# Patient Record
Sex: Male | Born: 1985 | Race: Black or African American | Hispanic: No | Marital: Single | Smoking: Current every day smoker
Health system: Southern US, Community
[De-identification: ages and names within clinical notes are randomized; demographics above are authoritative.]

## PROBLEM LIST (undated history)

## (undated) ENCOUNTER — Ambulatory Visit (HOSPITAL_COMMUNITY): Admission: EM | Payer: Self-pay | Source: Home / Self Care

## (undated) DIAGNOSIS — F4310 Post-traumatic stress disorder, unspecified: Secondary | ICD-10-CM

## (undated) DIAGNOSIS — I10 Essential (primary) hypertension: Secondary | ICD-10-CM

## (undated) DIAGNOSIS — M1990 Unspecified osteoarthritis, unspecified site: Secondary | ICD-10-CM

## (undated) HISTORY — PX: ABDOMINAL SURGERY: SHX537

---
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
## 2013-12-22 NOTE — ED Notes (Signed)
I have reviewed discharge instructions with the patient.  The patient verbalized understanding.      Pt given RX for following medications at discharge @MEDDISCHARGE@.  Pt discharged ambulatory from emergency department in no acute distress.
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
## 2014-03-31 NOTE — ED Notes (Signed)
Patient at this time stands up and asks how long it will be until he is seen by physician, when I advised that I was not sure and that there are several patient in ER today patient advised that he cant waste time not being seen in the hospital. Lacie DraftJ. Melton [facility] is advised that she will see patient, all visualized by patient who then asks again how long it will be. I advised patient that She will come to see him in a few minutes and patient states "I cant wait that long" and starts to walk from facility. Patient was advised to stop so I could remove IV, Patient waited for IV to be removed however would not stay to sign AMA paperwork.

## 2014-05-13 LAB — CBC WITH AUTOMATED DIFF
ABS. BASOPHILS: 0 10*3/uL (ref 0.0–0.2)
ABS. EOSINOPHILS: 0.2 10*3/uL (ref 0.0–0.8)
ABS. IMM. GRANS.: 0 10*3/uL (ref 0.0–0.5)
ABS. LYMPHOCYTES: 3.3 10*3/uL (ref 0.5–4.6)
ABS. MONOCYTES: 0.4 10*3/uL (ref 0.1–1.3)
ABS. NEUTROPHILS: 1.8 10*3/uL (ref 1.7–8.2)
BASOPHILS: 0 % (ref 0.0–2.0)
EOSINOPHILS: 3 % (ref 0.5–7.8)
HCT: 47.4 % (ref 41.1–50.3)
HGB: 16.5 g/dL (ref 13.6–17.2)
IMMATURE GRANULOCYTES: 0 % (ref 0.0–5.0)
LYMPHOCYTES: 58 % — ABNORMAL HIGH (ref 13–44)
MCH: 32.2 PG (ref 26.1–32.9)
MCHC: 34.8 g/dL (ref 31.4–35.0)
MCV: 92.6 FL (ref 79.6–97.8)
MONOCYTES: 7 % (ref 4.0–12.0)
MPV: 9.3 FL — ABNORMAL LOW (ref 10.8–14.1)
NEUTROPHILS: 32 % — ABNORMAL LOW (ref 43–78)
PLATELET COMMENTS: ADEQUATE
PLATELET: 337 10*3/uL (ref 150–450)
RBC: 5.12 M/uL (ref 4.23–5.67)
RDW: 13.9 % (ref 11.9–14.6)
WBC: 5.7 10*3/uL (ref 4.3–11.1)

## 2014-05-13 LAB — METABOLIC PANEL, COMPREHENSIVE
A-G Ratio: 0.9 — ABNORMAL LOW (ref 1.2–3.5)
ALT (SGPT): 40 U/L (ref 12–65)
AST (SGOT): 42 U/L — ABNORMAL HIGH (ref 15–37)
Albumin: 3.9 g/dL (ref 3.5–5.0)
Alk. phosphatase: 89 U/L (ref 50–136)
Anion gap: 9 mmol/L (ref 7–16)
BUN: 6 MG/DL (ref 6–23)
Bilirubin, total: 0.2 MG/DL (ref 0.2–1.1)
CO2: 24 mmol/L (ref 21–32)
Calcium: 8.4 MG/DL (ref 8.3–10.4)
Chloride: 111 mmol/L — ABNORMAL HIGH (ref 98–107)
Creatinine: 1.17 MG/DL (ref 0.8–1.5)
GFR est AA: >60 mL/min/{1.73_m2} (ref >60)
GFR est non-AA: >60 mL/min/{1.73_m2} (ref >60)
Globulin: 4.4 g/dL — ABNORMAL HIGH (ref 2.3–3.5)
Glucose: 98 mg/dL (ref 65–100)
Potassium: 3.8 mmol/L (ref 3.5–5.1)
Protein, total: 8.3 g/dL — ABNORMAL HIGH (ref 6.3–8.2)
Sodium: 144 mmol/L (ref 136–145)

## 2014-05-13 LAB — ETHYL ALCOHOL: ALCOHOL(ETHYL),SERUM: 304 MG/DL

## 2014-05-13 LAB — PHENYTOIN: Phenytoin: 0.9 ug/mL — ABNORMAL LOW (ref 10–20)

## 2014-05-13 MED ORDER — LORAZEPAM 2 MG/ML IJ SOLN
2 mg/mL | INTRAMUSCULAR | Status: AC
Start: 2014-05-13 — End: 2014-05-13
  Administered 2014-05-13: 18:00:00 via INTRAMUSCULAR

## 2014-05-13 MED ORDER — PHENYTOIN SODIUM EXTENDED 100 MG CAP
100 mg | ORAL_CAPSULE | Freq: Three times a day (TID) | ORAL | Status: AC
Start: 2014-05-13 — End: 2014-06-12

## 2014-05-13 MED ORDER — LORAZEPAM 2 MG/ML IJ SOLN
2 mg/mL | INTRAMUSCULAR | Status: AC
Start: 2014-05-13 — End: 2014-05-14

## 2014-05-13 MED FILL — LORAZEPAM 2 MG/ML IJ SOLN: 2 mg/mL | INTRAMUSCULAR | Qty: 1

## 2014-05-13 NOTE — ED Notes (Signed)
Patient sleeping comfortably in bed
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
## 2014-05-13 NOTE — ED Notes (Signed)
Pt here with complaint of seizures, pt states he is prescribed dilantin but does not take it.

## 2014-05-13 NOTE — Other (Signed)
I have reviewed discharge instructions with the patient.  The patient verbalized understanding. Patient given discharge paperwork and instruction on F/u. Patient given prescriptions today with instruction given on proper medciation usage. Patient walked fro dept with friend. Present at bedside.

## 2014-12-10 DIAGNOSIS — R400 Somnolence: Secondary | ICD-10-CM

## 2014-12-10 NOTE — ED Notes (Signed)
[place] city bus[place]  Difficult to wake by EMS.  Pt has history of seizures and does not take any medication.

## 2014-12-10 NOTE — ED Notes (Signed)
I have reviewed discharge instructions with the patient.  The patient verbalized understanding.
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
## 2014-12-11 ENCOUNTER — Inpatient Hospital Stay
Admit: 2014-12-11 | Discharge: 2014-12-11 | Disposition: A | Discharge status: Home / Self Care | Payer: MEDICAID | Attending: Emergency Medicine

## 2014-12-11 LAB — GLUCOSE, POC
Glucose (POC): 113 mg/dL — ABNORMAL HIGH (ref 65–100)
Glucose (POC): 101 mg/dL — ABNORMAL HIGH (ref 65–100)

## 2017-03-06 ENCOUNTER — Emergency Department (HOSPITAL_COMMUNITY)
Admission: EM | Admit: 2017-03-06 | Discharge: 2017-03-06 | Disposition: A | Discharge status: Home / Self Care | Payer: Medicaid Other | Source: Home / Self Care | Attending: Emergency Medicine | Admitting: Emergency Medicine

## 2017-03-06 ENCOUNTER — Emergency Department (HOSPITAL_COMMUNITY)
Admission: EM | Admit: 2017-03-06 | Discharge: 2017-03-06 | Payer: Medicaid Other | Attending: Emergency Medicine | Admitting: Emergency Medicine

## 2017-03-06 ENCOUNTER — Encounter (HOSPITAL_COMMUNITY): Payer: Self-pay | Admitting: Oncology

## 2017-03-06 ENCOUNTER — Emergency Department (HOSPITAL_COMMUNITY): Payer: Medicaid Other

## 2017-03-06 DIAGNOSIS — R45851 Suicidal ideations: Secondary | ICD-10-CM | POA: Diagnosis not present

## 2017-03-06 DIAGNOSIS — Y929 Unspecified place or not applicable: Secondary | ICD-10-CM | POA: Insufficient documentation

## 2017-03-06 DIAGNOSIS — F10920 Alcohol use, unspecified with intoxication, uncomplicated: Secondary | ICD-10-CM | POA: Insufficient documentation

## 2017-03-06 DIAGNOSIS — Y999 Unspecified external cause status: Secondary | ICD-10-CM | POA: Diagnosis not present

## 2017-03-06 DIAGNOSIS — I10 Essential (primary) hypertension: Secondary | ICD-10-CM

## 2017-03-06 DIAGNOSIS — Y939 Activity, unspecified: Secondary | ICD-10-CM | POA: Diagnosis not present

## 2017-03-06 DIAGNOSIS — S0990XA Unspecified injury of head, initial encounter: Secondary | ICD-10-CM | POA: Insufficient documentation

## 2017-03-06 DIAGNOSIS — W2209XA Striking against other stationary object, initial encounter: Secondary | ICD-10-CM | POA: Insufficient documentation

## 2017-03-06 DIAGNOSIS — F17210 Nicotine dependence, cigarettes, uncomplicated: Secondary | ICD-10-CM | POA: Diagnosis not present

## 2017-03-06 HISTORY — DX: Unspecified osteoarthritis, unspecified site: M19.90

## 2017-03-06 HISTORY — DX: Post-traumatic stress disorder, unspecified: F43.10

## 2017-03-06 HISTORY — DX: Essential (primary) hypertension: I10

## 2017-03-06 LAB — COMPREHENSIVE METABOLIC PANEL
ALBUMIN: 3.7 g/dL (ref 3.5–5.0)
ALT: 24 U/L (ref 17–63)
ANION GAP: 9 (ref 5–15)
AST: 36 U/L (ref 15–41)
Alkaline Phosphatase: 77 U/L (ref 38–126)
BUN: 7 mg/dL (ref 6–20)
CHLORIDE: 112 mmol/L — AB (ref 101–111)
CO2: 23 mmol/L (ref 22–32)
Calcium: 8.1 mg/dL — ABNORMAL LOW (ref 8.9–10.3)
Creatinine, Ser: 1.2 mg/dL (ref 0.61–1.24)
GFR calc Af Amer: >60 mL/min (ref >60)
GFR calc non Af Amer: >60 mL/min (ref >60)
GLUCOSE: 84 mg/dL (ref 65–99)
Potassium: 3.7 mmol/L (ref 3.5–5.1)
SODIUM: 144 mmol/L (ref 135–145)
Total Bilirubin: 0.5 mg/dL (ref 0.3–1.2)
Total Protein: 7 g/dL (ref 6.5–8.1)

## 2017-03-06 LAB — CBC WITH DIFFERENTIAL/PLATELET
BASOS PCT: 1 %
Basophils Absolute: 0 10*3/uL (ref 0.0–0.1)
EOS ABS: 0.1 10*3/uL (ref 0.0–0.7)
EOS PCT: 1 %
HCT: 41.6 % (ref 39.0–52.0)
HEMOGLOBIN: 14.8 g/dL (ref 13.0–17.0)
Lymphocytes Relative: 56 %
Lymphs Abs: 3.7 10*3/uL (ref 0.7–4.0)
MCH: 31 pg (ref 26.0–34.0)
MCHC: 35.6 g/dL (ref 30.0–36.0)
MCV: 87 fL (ref 78.0–100.0)
Monocytes Absolute: 0.3 10*3/uL (ref 0.1–1.0)
Monocytes Relative: 4 %
NEUTROS PCT: 38 %
Neutro Abs: 2.5 10*3/uL (ref 1.7–7.7)
PLATELETS: 280 10*3/uL (ref 150–400)
RBC: 4.78 MIL/uL (ref 4.22–5.81)
RDW: 13.4 % (ref 11.5–15.5)
WBC: 6.6 10*3/uL (ref 4.0–10.5)

## 2017-03-06 LAB — ETHANOL: ALCOHOL ETHYL (B): 229 mg/dL — AB (ref <5)

## 2017-03-06 LAB — CBG MONITORING, ED: Glucose-Capillary: 83 mg/dL (ref 65–99)
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
## 2017-03-06 NOTE — ED Notes (Signed)
MD at bedside. 

## 2017-03-06 NOTE — ED Notes (Signed)
Patient alert, sitting up in bed. MD made aware.
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
## 2017-03-06 NOTE — ED Notes (Signed)
ED Provider at bedside. 
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
## 2017-03-06 NOTE — ED Notes (Signed)
Patient transported to CT 
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
## 2018-08-03 IMAGING — CT CT HEAD W/O CM
3 of 4 series · 14 of 47 positions shown, 16 images · non-contrast
Comparison: None.

CLINICAL DATA: Alcohol intoxication.

EXAM:
CT HEAD WITHOUT CONTRAST
TECHNIQUE: Contiguous axial images were obtained from the base of the skull
through the vertex without intravenous contrast.

[Series 2: head w/o · axial · non-contrast · 0.45mm/px · z∈[-62,+64]mm · 8 of 31 slices shown, 10 images]
[im 3/31  brain]
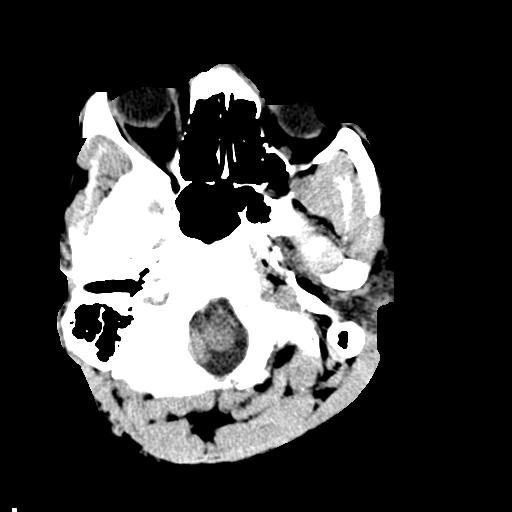
[im 3/31  bone]
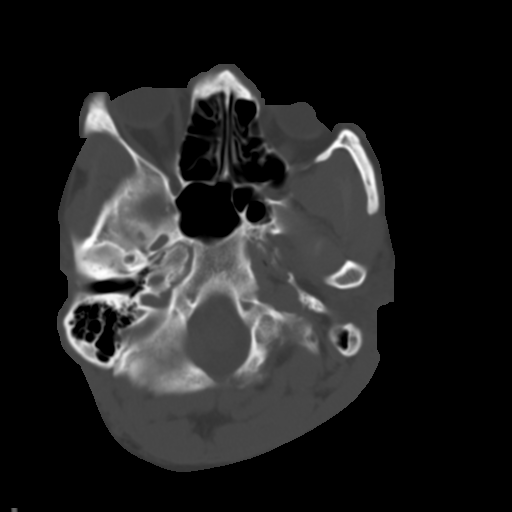
[im 7/31  brain]
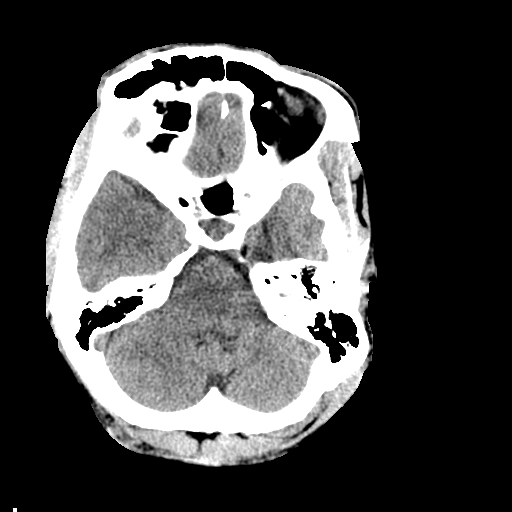
[im 11/31  brain]
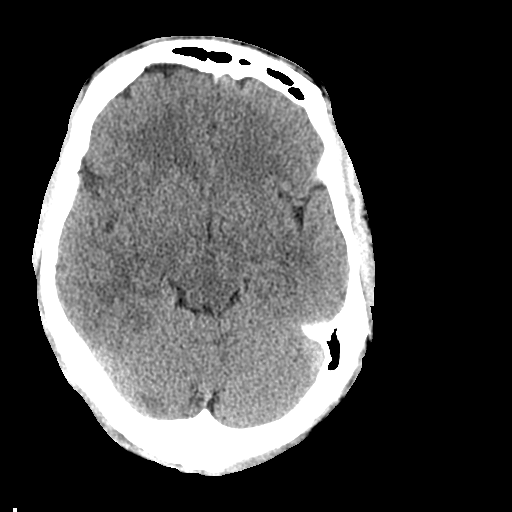
[im 13/31  brain]
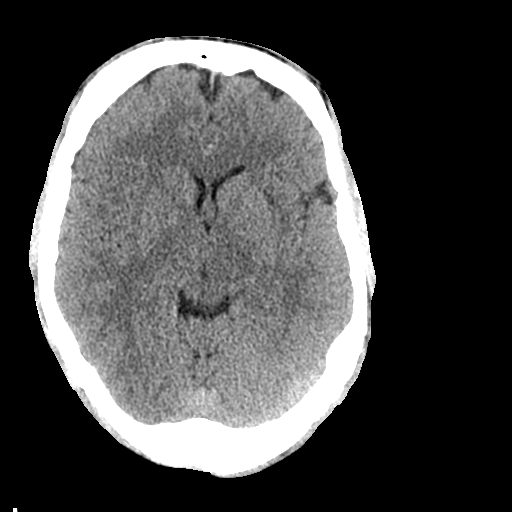
[im 18/31  brain]
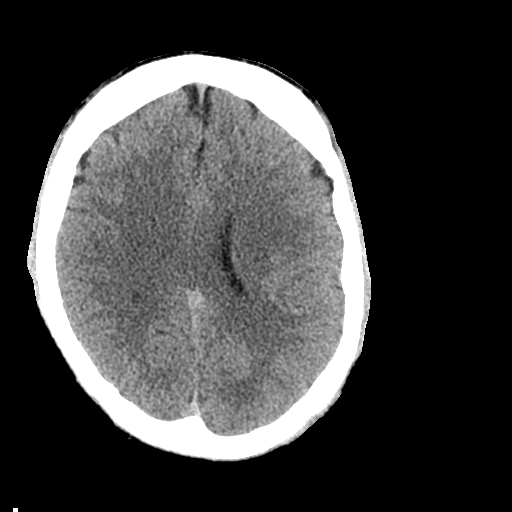
[im 18/31  bone]
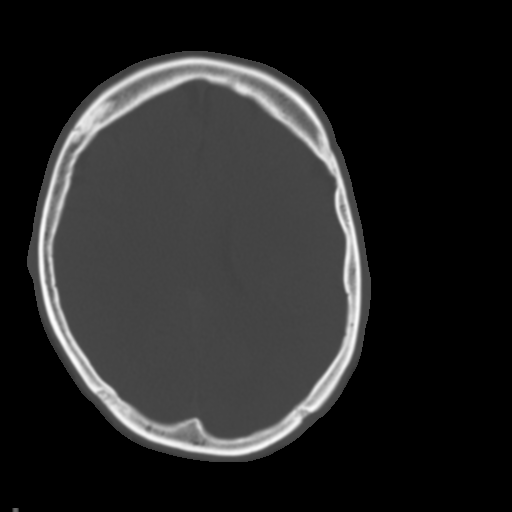
[im 20/31  brain]
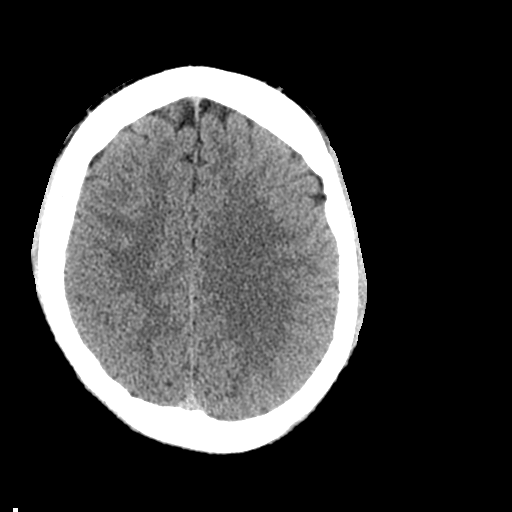
[im 24/31  brain]
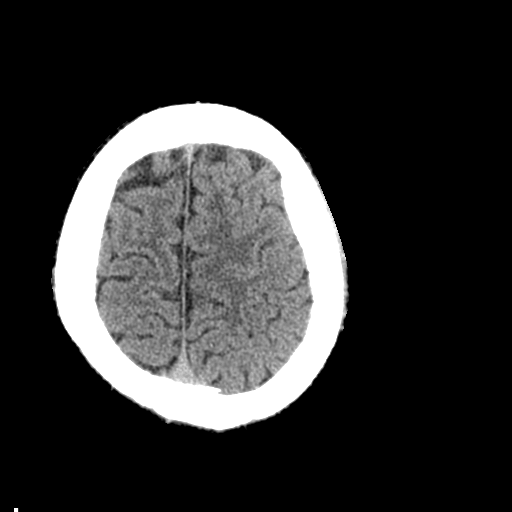
[im 28/31  brain]
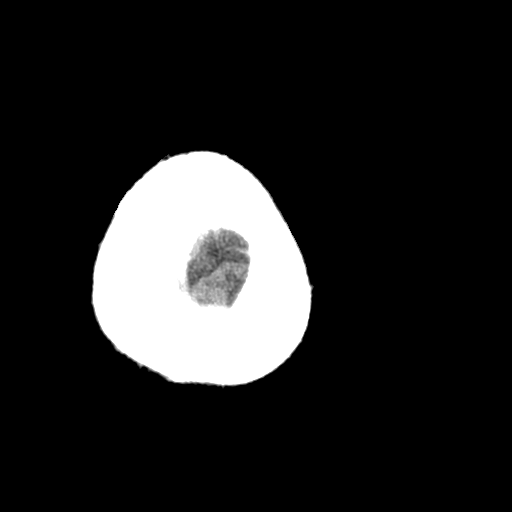

[Series 4: coronal · coronal · 0.30mm/px · 3 of 77 slices shown]
[im 26/77  brain]
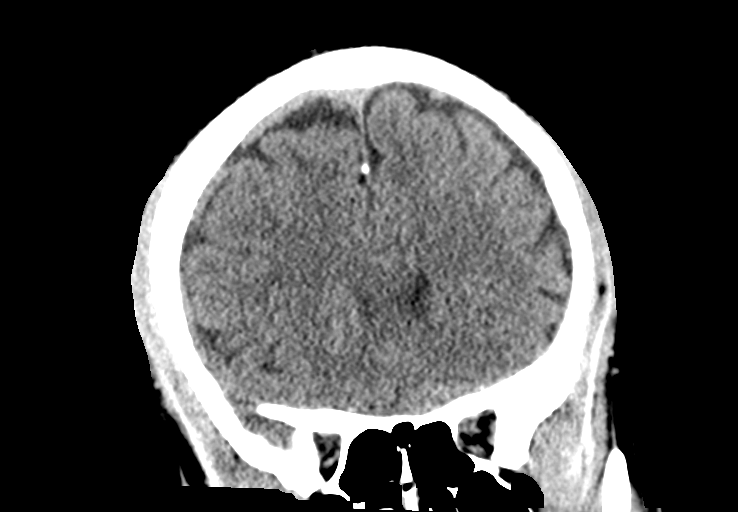
[im 34/77  brain]
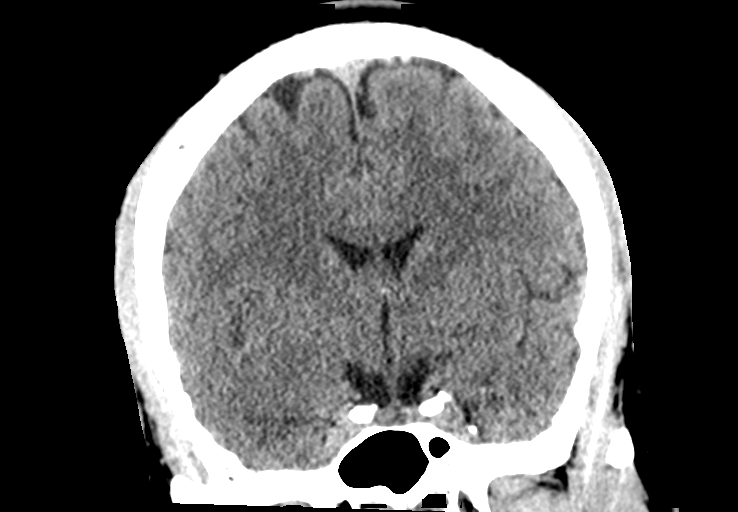
[im 43/77  brain]
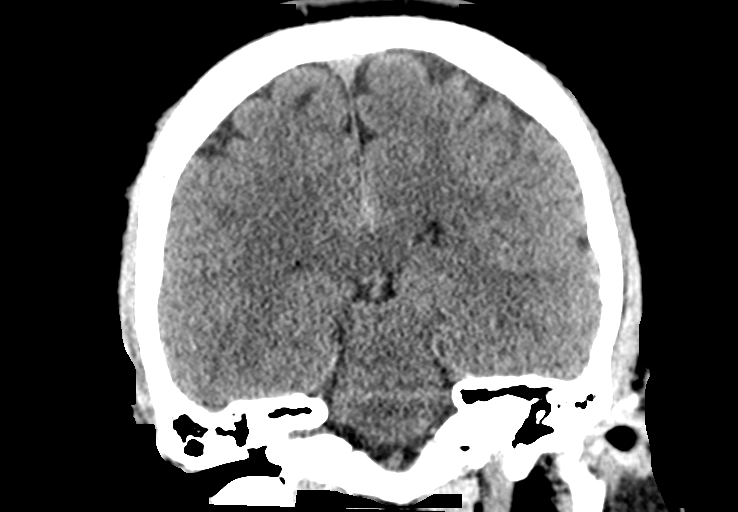

[Series 5: sagittal · sagittal · 0.33mm/px · 3 of 77 slices shown]
[im 26/77  brain]
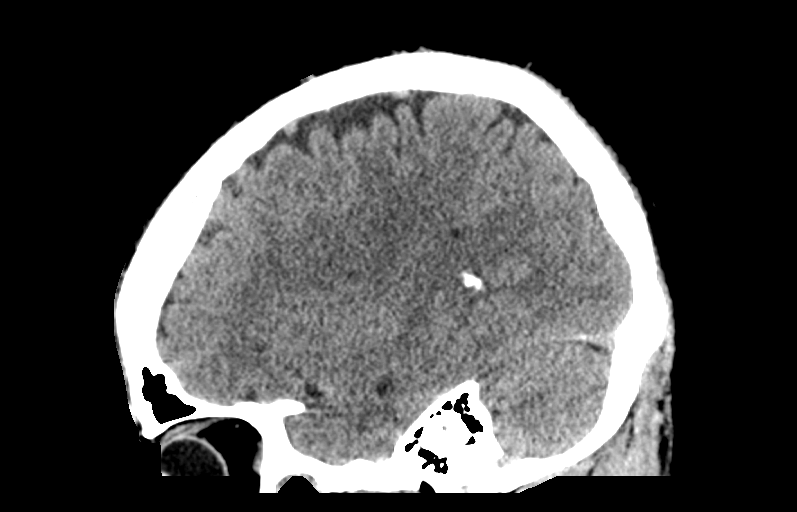
[im 39/77  brain]
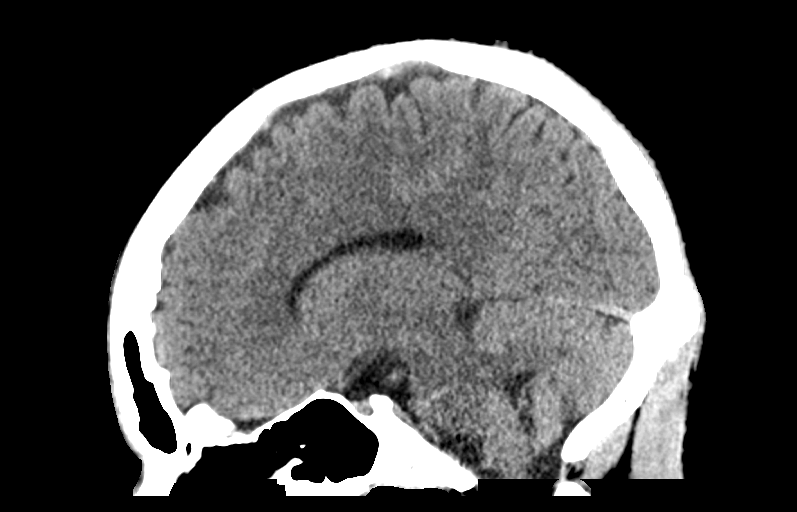
[im 51/77  brain]
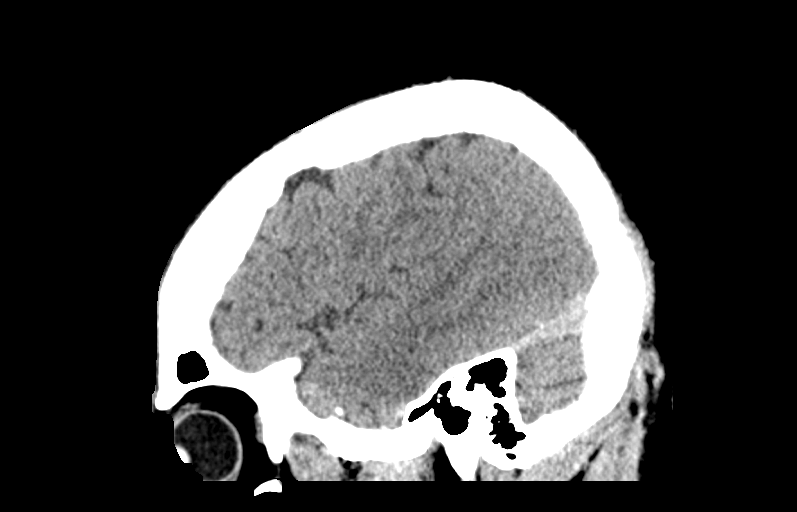

[14 of 47 positions shown; findings below may reference images not displayed]

FINDINGS: Brain: No evidence of acute infarction, hemorrhage, hydrocephalus,
extra-axial collection or mass lesion/mass effect.

Vascular: No hyperdense vessel or unexpected calcification.

Skull: Normal. Negative for fracture or focal lesion.

Sinuses/Orbits: No acute finding.

Other: None.
IMPRESSION: No acute intracranial abnormality.

## 2021-03-24 ENCOUNTER — Other Ambulatory Visit: Payer: Self-pay
